# Patient Record
Sex: Male | Born: 1995 | Race: White | Hispanic: No | Marital: Single | Smoking: Never smoker
Health system: Southern US, Community
[De-identification: ages and names within clinical notes are randomized; demographics above are authoritative.]

## PROBLEM LIST (undated history)

## (undated) DIAGNOSIS — Z0110 Encounter for examination of ears and hearing without abnormal findings: Secondary | ICD-10-CM

## (undated) DIAGNOSIS — Z8781 Personal history of (healed) traumatic fracture: Secondary | ICD-10-CM

## (undated) HISTORY — DX: Encounter for examination of ears and hearing without abnormal findings: Z01.10

## (undated) HISTORY — PX: OTHER SURGICAL HISTORY: SHX169

## (undated) HISTORY — DX: Personal history of (healed) traumatic fracture: Z87.81

---
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
## 2015-04-29 ENCOUNTER — Emergency Department
Admission: EM | Admit: 2015-04-29 | Discharge: 2015-04-29 | Disposition: A | Discharge status: Home / Self Care | Payer: Managed Care, Other (non HMO) | Attending: Emergency Medicine | Admitting: Emergency Medicine

## 2015-04-29 ENCOUNTER — Encounter: Payer: Self-pay | Admitting: Emergency Medicine

## 2015-04-29 ENCOUNTER — Emergency Department: Payer: Managed Care, Other (non HMO)

## 2015-04-29 DIAGNOSIS — R1012 Left upper quadrant pain: Secondary | ICD-10-CM | POA: Diagnosis present

## 2015-04-29 DIAGNOSIS — B2790 Infectious mononucleosis, unspecified without complication: Secondary | ICD-10-CM | POA: Diagnosis not present

## 2015-04-29 NOTE — ED Provider Notes (Signed)
[facility] Emergency Department Provider Note  ____________________________________________  Time seen: 1340  I have reviewed the triage vital signs and the nursing notes.  History by:  Patient and his mother  HISTORY  Chief Complaint Abdominal Pain     HPI Bruce Howell is a 20 y.o. male who was recently diagnosed with mononucleosis. He started having weakness and other symptoms approximately 8-9 days ago. He was seen by student health in [place] where he goes to [employer]. He plays football and has been advised not to resume vigorous activity. This morning, without any event or trauma, he began to have some discomfort in his left upper abdomen. The patient and his mother are concerned about possible splenomegaly or complications of mononucleosis.   History reviewed. No pertinent past medical history.  There are no active problems to display for this patient.   History reviewed. No pertinent past surgical history.  No current outpatient prescriptions on file.  Allergies Review of patient's allergies indicates not on file.  History reviewed. No pertinent family history.  Social History Social History  Substance Use Topics  . Smoking status: Never Smoker   . Smokeless tobacco: None  . Alcohol Use: None    Review of Systems  Constitutional: Currently with mononucleosis. See history of present illness ENT: Negative for congestion. Cardiovascular: Negative for chest pain. Respiratory: Negative for cough. Gastrointestinal: Abdominal pain, left upper quadrant - see history of present illness Genitourinary: Negative for dysuria. Musculoskeletal: No back pain. Skin: Negative for rash. Neurological: Negative for headache or focal weakness   10-point ROS otherwise negative.  ____________________________________________   PHYSICAL EXAM:  VITAL SIGNS: ED Triage Vitals  Enc Vitals Group     BP 04/29/15 1241 115/81 mmHg     Pulse Rate  04/29/15 1241 69     Resp 04/29/15 1241 16     Temp 04/29/15 1241 98.1 F (36.7 C)     Temp Source 04/29/15 1241 Oral     SpO2 04/29/15 1241 100 %     Weight 04/29/15 1241 170 lb (77.111 kg)     Height 04/29/15 1241  (1.854 m)     Head Cir --      Peak Flow --      Pain Score 04/29/15 1243 6     Pain Loc --      Pain Edu? --      Excl. in GC? --     Constitutional: Alert and oriented. Well appearing and in no distress. ENT   Head: Normocephalic and atraumatic.   Nose: No congestion/rhinnorhea.       Mouth: No erythema, no swelling   Cardiovascular: Normal rate, regular rhythm, no murmur noted Respiratory:  Normal respiratory effort, no tachypnea.    Breath sounds are clear and equal bilaterally.  Gastrointestinal: Soft, no distention. Minimal tenderness in the left upper quadrant wrapping around a little bit laterally. Back: No muscle spasm, no tenderness, no CVA tenderness. Musculoskeletal: No deformity noted. Nontender with normal range of motion in all extremities.  No noted edema. Neurologic:  Communicative. Normal appearing spontaneous movement in all 4 extremities. No gross focal neurologic deficits are appreciated.  Skin:  Skin is warm, dry. No rash noted. Psychiatric: Mood and affect are normal. Speech and behavior are normal.  ____________________________________________    LABS (pertinent positives/negatives)    ____________________________________________   EKG    ____________________________________________    RADIOLOGY  Ultrasound, left upper quadrant IMPRESSION: Increased splenic size when compared to the 2011 CT  Abdomen and Pelvis, but splenic volume remains within the normal range. No discrete splenic lesion or perisplenic fluid.  ____________________________________________   PROCEDURES  ____________________________________________   INITIAL IMPRESSION / ASSESSMENT AND PLAN / ED COURSE  Pertinent labs & imaging results that  were available during my care of the patient were reviewed by me and considered in my medical decision making (see chart for details).  Overall well-appearing 19 ye40ar old male in no acute distress. He has had some discomfort in left upper quadrant. He has been recently diagnosed with mononucleosis. We will obtain an ultrasound to assess spleen size primarily for baseline measurement purposes. I have very low too little suspicion for a rupture currently.  ----------------------------------------- 3:06 PM on 04/29/2015 -----------------------------------------  Ultrasound shows an increased splenic size compared to a previous CT but the volume remains within normal range. No acute abnormality noted.  ____________________________________________   FINAL CLINICAL IMPRESSION(S) / ED DIAGNOSES  Final diagnoses:  Abdominal pain, left upper quadrant   mononucleosis    Darien Ramus, MD 04/29/15 [phone]

## 2015-04-29 NOTE — ED Notes (Signed)
Dx with mono and an earache on Friday - has left upper abd pain, and parents concerned. vss

## 2015-04-29 NOTE — Discharge Instructions (Signed)
Your ultrasound showed a spleen that was within a normal size range, though is slightly bigger then how it appeared 6 years ago on CT scan. As previously instructed, avoid trauma, collisions, including contact sports. If your pain worsens or if you have other urgent concerns, return to the emergency department.

## 2015-10-12 ENCOUNTER — Ambulatory Visit (INDEPENDENT_AMBULATORY_CARE_PROVIDER_SITE_OTHER): Payer: Managed Care, Other (non HMO) | Admitting: Family Medicine

## 2015-10-12 ENCOUNTER — Encounter: Payer: Self-pay | Admitting: Family Medicine

## 2015-10-12 VITALS — BP 124/70 | HR 58 | Temp 98.2°F | Ht 71.0 in | Wt 166.0 lb

## 2015-10-12 DIAGNOSIS — Z025 Encounter for examination for participation in sport: Secondary | ICD-10-CM

## 2015-10-12 DIAGNOSIS — Z130 Encounter for screening for diseases of the blood and blood-forming organs and certain disorders involving the immune mechanism: Secondary | ICD-10-CM | POA: Diagnosis not present

## 2015-10-12 NOTE — Patient Instructions (Signed)
Sickle cell screening test today  No restrictions for football Be careful and stay hydrated

## 2015-10-12 NOTE — Progress Notes (Signed)
Subjective:    Patient ID: Bruce Howell, male    DOB: [birth year], 20 y.o.   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 Screening for sickle-cell disease or trait 10/12/2015  . Encounter for examination for participation in sport 10/12/2015  . Well adolescent visit 11/08/2010  . DERMATITIS, SEBORRHEIC 12/31/2007  . GANGLION CYST 12/31/2007  . ALLERGIC RHINITIS, SEASONAL 09/24/2006   Past Medical History  Diagnosis Date  . Concussion 2001    no residual symptoms  . Other examination of ears and hearing     [facility]  . History of skull fracture     concussion; LOC x 3 days   Past Surgical History  Procedure Laterality Date  . Other surgical history      concussion, skull fracture--LOC x 3 days   Social History  Substance Use Topics  . Smoking status: Never Smoker   . Smokeless tobacco: None  . Alcohol Use: 0.0 oz/week    0 Standard drinks or equivalent per week     Comment: occ   No family history on file. Allergies  Allergen Reactions  . Amoxicillin     REACTION: rash   Current Outpatient Prescriptions on File Prior to Visit  Medication Sig Dispense Refill  . Multiple Vitamin (MULTIVITAMIN) tablet Take 1 tablet by  mouth daily.       No current facility-administered medications on file prior to visit.    Review of Systems Review of Systems  Constitutional: Negative for fever, appetite change, fatigue and unexpected weight change.  Eyes: Negative for pain and visual disturbance.  Respiratory: Negative for cough and shortness of breath.   Cardiovascular: Negative for cp or palpitations    Gastrointestinal: Negative for nausea, diarrhea and constipation.  Genitourinary: Negative for urgency and frequency.  Skin: Negative for pallor or rash   Neurological: Negative for weakness, light-headedness, numbness and headaches.  Hematological: Negative for adenopathy. Does not bruise/bleed easily.  Psychiatric/Behavioral: Negative for dysphoric mood. The patient is not nervous/anxious.         Objective:   Physical Exam  Constitutional: He appears well-developed and well-nourished. No distress.  Well appearing   HENT:  Head: Normocephalic and atraumatic.  Mouth/Throat: Oropharynx is clear and moist.  Eyes: Conjunctivae and EOM are normal. Pupils are equal, round, and reactive to light. No scleral icterus.  Neck: Normal range of motion. Neck supple. No thyromegaly present.  Cardiovascular: Normal rate, regular rhythm and normal heart sounds.   Pulmonary/Chest: Effort normal and breath sounds normal. He has no wheezes.  Musculoskeletal: He exhibits no edema or  tenderness.  No scoliosis   Lymphadenopathy:    He has no cervical adenopathy.  Neurological: He is alert. He has normal reflexes. No cranial nerve deficit. He exhibits normal muscle tone. Coordination normal.  Skin: Skin is warm and dry. No rash noted. No erythema.  Psychiatric: He has a normal mood and affect.          Assessment & Plan:   Problem List Items Addressed This Visit      Other   Screening for sickle-cell disease or trait - Primary    Screening test done today as required by his program for football  No family hx of  symptoms       Relevant Orders   Sickle cell screen (Completed)   Encounter for examination for participation in sport    Pt had one concussion last year - has resolved Disc prev of injuries and dehydration for sports  No restrictions for upcoming season [facility] by [facility] program

## 2015-10-12 NOTE — Progress Notes (Signed)
Pre visit review using our clinic review tool, if applicable. No additional management support is needed unless otherwise documented below in the visit note. 

## 2015-10-13 LAB — SICKLE CELL SCREEN: Sickle Cell Screen: NEGATIVE

## 2015-10-14 NOTE — Assessment & Plan Note (Signed)
Pt had one concussion last year - has resolved Disc prev of injuries and dehydration for sports  No restrictions for upcoming season [facility] by [facility] program

## 2015-10-14 NOTE — Assessment & Plan Note (Signed)
Screening test done today as required by his program for football  No family hx of symptoms

## 2015-10-24 ENCOUNTER — Encounter: Payer: Self-pay | Admitting: Family Medicine

## 2015-11-14 ENCOUNTER — Telehealth: Payer: Self-pay | Admitting: Family Medicine

## 2015-11-14 NOTE — Telephone Encounter (Signed)
Done and pt's mother notified

## 2015-11-14 NOTE — Telephone Encounter (Signed)
Pt needs immunizations faxed to [employer] fax number [phone]

## 2017-04-23 IMAGING — US US ABDOMEN LIMITED
1 series · 12 of 12 positions shown · non-contrast
Comparison: CT Abdomen and Pelvis 03/29/2010

CLINICAL DATA: 19-year-old male diagnosed with mononucleosis. Left
upper quadrant pain for 6 hours. Initial encounter.

EXAM:
LIMITED ABDOMINAL ULTRASOUND

[Series 1: us abdomen limited · 0.22mm/px · 12 of 12 slices shown]
[im 1/12]
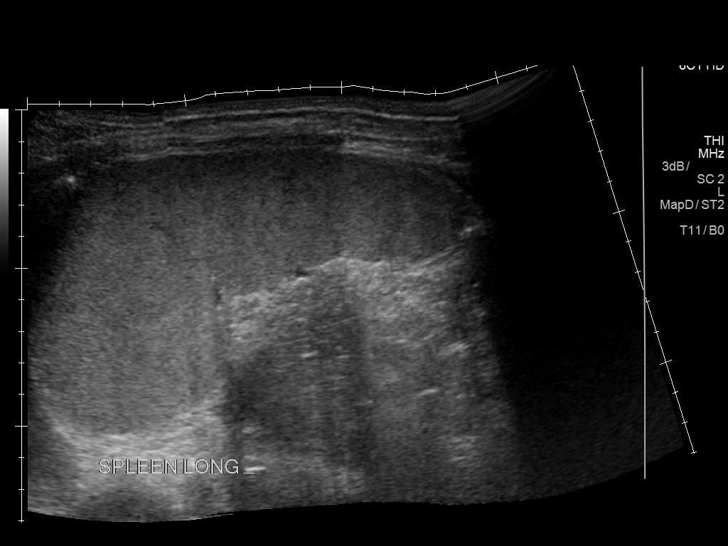
[im 2/12]
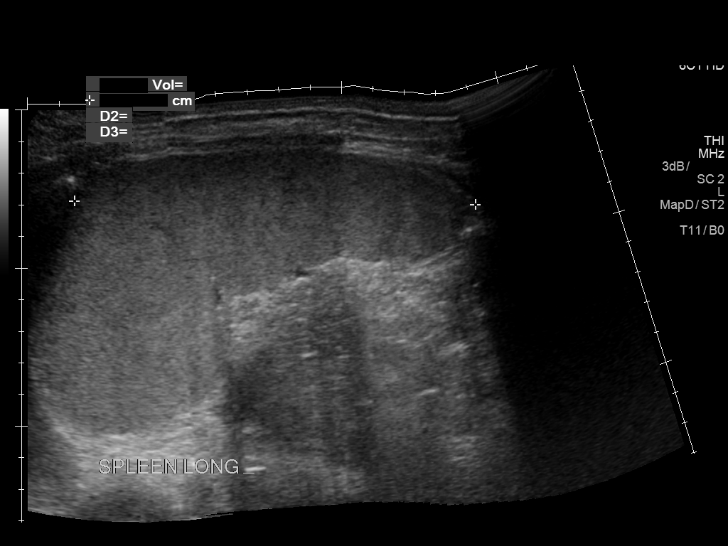
[im 3/12]
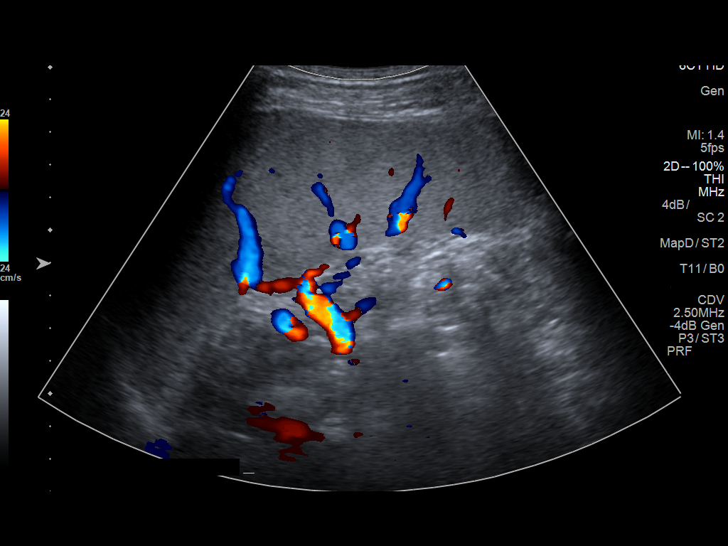
[im 4/12]
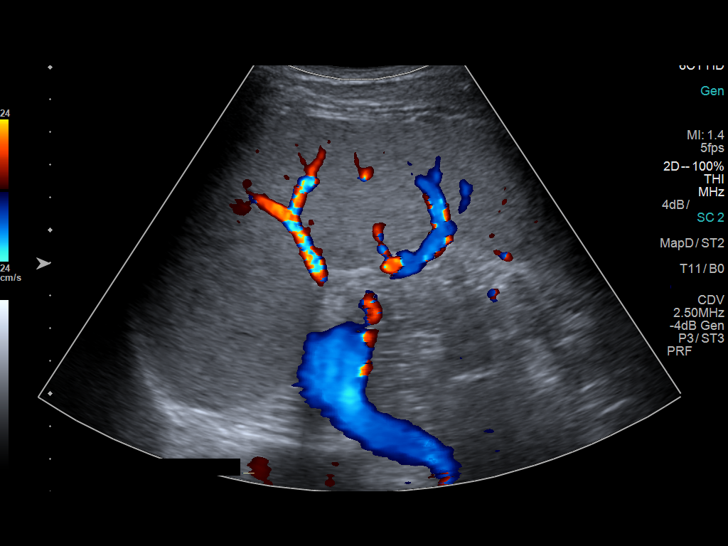
[im 5/12]
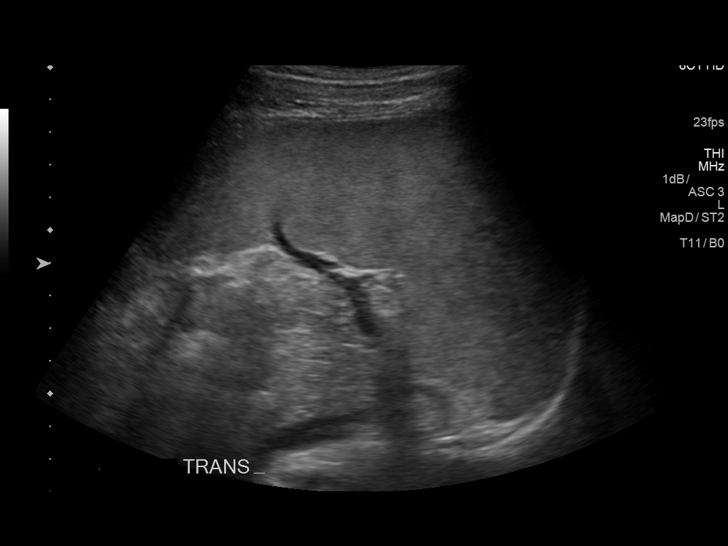
[im 6/12]
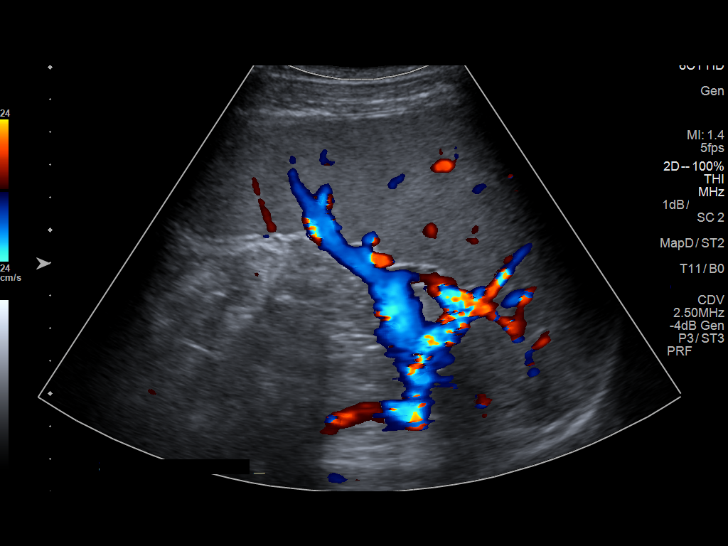
[im 7/12]
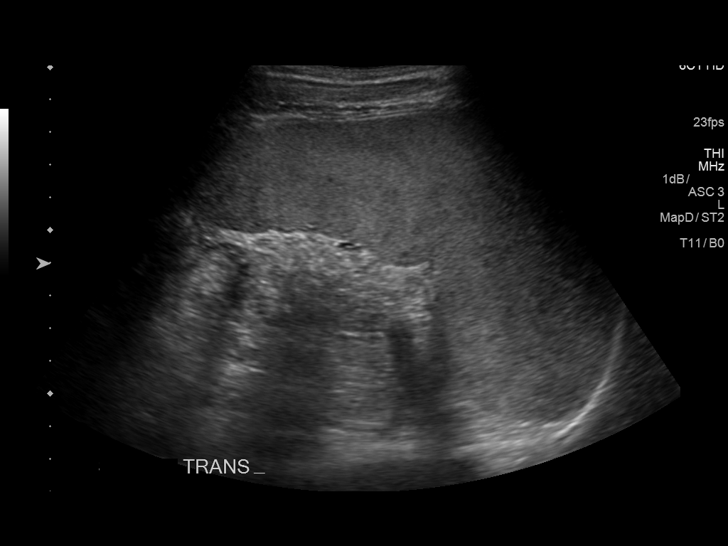
[im 8/12]
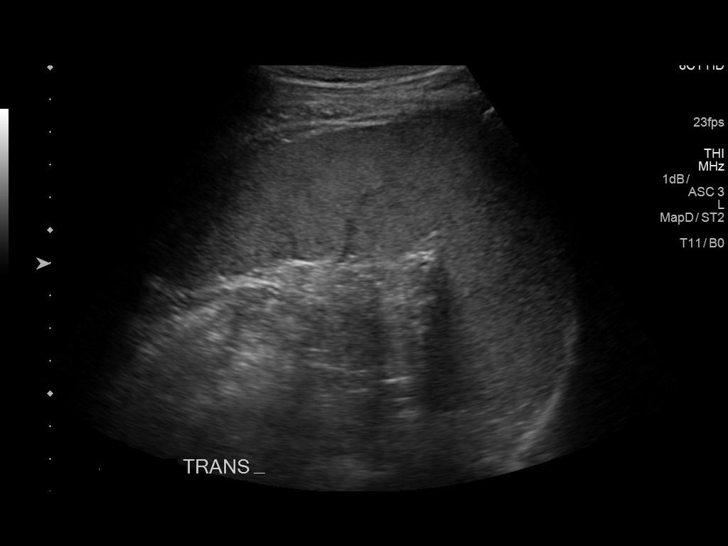
[im 9/12]
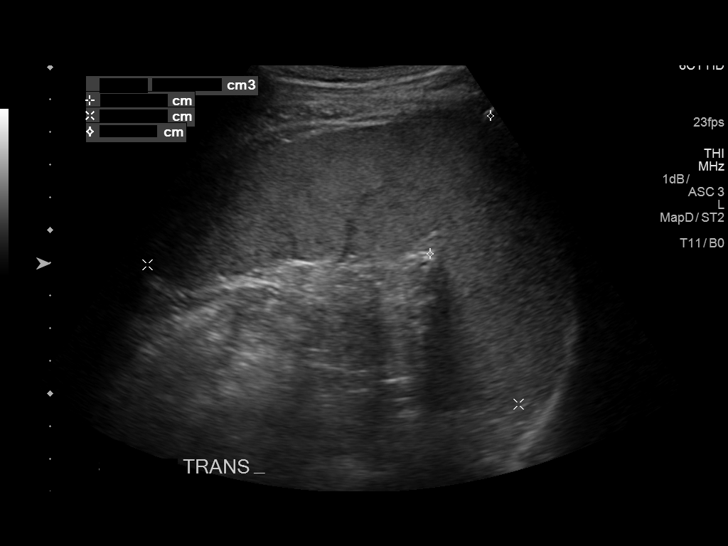
[im 10/12]
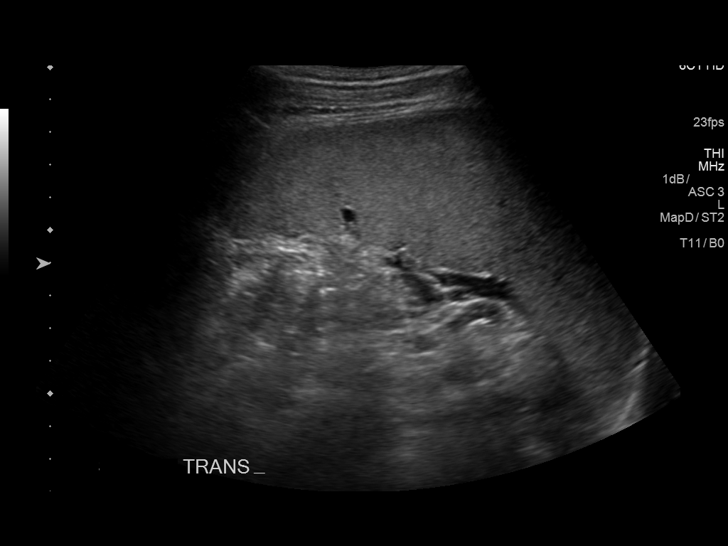
[im 11/12]
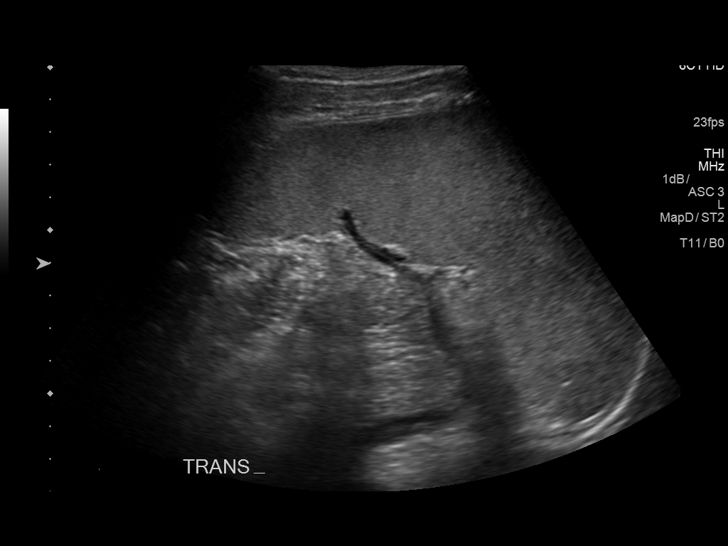
[im 12/12]
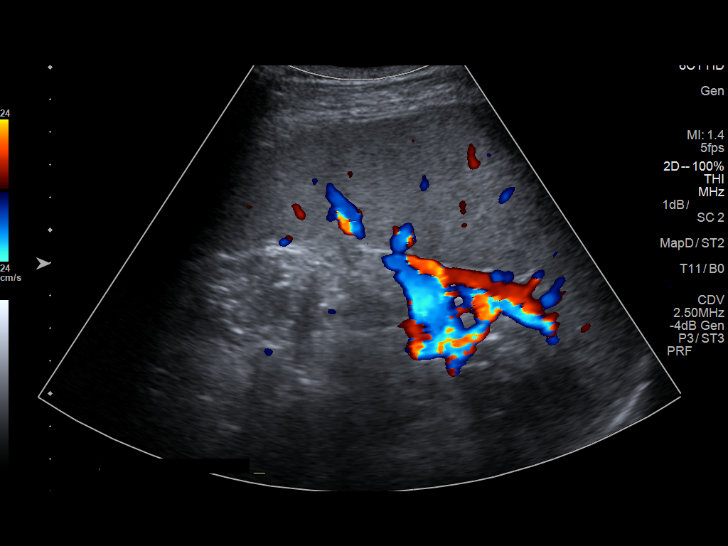

[12 of 12 positions shown; findings below may reference images not displayed]

FINDINGS: Splenic length is 12.6 cm with splenic volume today estimated at 370
mL. Normal splenic volume range is considered 83 - 412 mL. Estimated
splenic volume on the 7055 comparison of 260 mL.

Splenic echotexture appears normal. No discrete splenic lesion. No
perisplenic fluid.
IMPRESSION: Increased splenic size when compared to the 7055 CT Abdomen and
Pelvis, but splenic volume remains within the normal range. No
discrete splenic lesion or perisplenic fluid.

## 2018-09-08 ENCOUNTER — Other Ambulatory Visit: Payer: Self-pay

## 2018-09-08 ENCOUNTER — Ambulatory Visit: Payer: Managed Care, Other (non HMO) | Admitting: Family Medicine

## 2018-09-08 ENCOUNTER — Encounter: Payer: Self-pay | Admitting: Family Medicine

## 2018-09-08 VITALS — BP 122/84 | HR 96 | Temp 98.2°F | Ht 71.0 in | Wt 168.0 lb

## 2018-09-08 DIAGNOSIS — Z7689 Persons encountering health services in other specified circumstances: Secondary | ICD-10-CM

## 2018-09-08 DIAGNOSIS — T753XXA Motion sickness, initial encounter: Secondary | ICD-10-CM | POA: Diagnosis not present

## 2018-09-08 NOTE — Progress Notes (Signed)
Subjective:    Patient ID: Bruce Howell, male    DOB: [birth year], 23 y.o.   MRN: [id number]  HPI This is a 23 yo male who presents today as a new patient with new complaint.Getting ready to graduate from [employer]. 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 He has one class remaining. Enjoys surfing, fishing.  Has been getting care at [facility], last CPE 11/2017.   He reports recent sensation of feeling like he is on a boat which followed a deep sea fishing trip from which he returned 3 days ago. He has persistent sensation of still being on the boat. No sensation of spinning, feels like he has had too many alcoholic beverages. Has tried dramamine non drowsy formula (Meclizine 25 mg x 2) without relief. Did ok while out deep sea fishing for about 7 hours. Felt ok on the boat. No sore throat, no nasal congestion, no fever, no ear pain or pressure. No visual changes, no unusual headache. Doesn't drink much liquid- 1 bottle of water and some Gatorade daily. Exercises regularly and has been exercising this week, not as hard as usual, feels a little fatigued. Has been feeling a little more anxious with this persistent feeling, was doing fine prior.   Past Medical History:  Diagnosis Date  . Concussion 2001   no residual symptoms  . History of skull fracture    concussion; LOC x 3 days  . Other examination of ears and hearing    borderline [facility]   Past Surgical History:  Procedure Laterality Date  . OTHER SURGICAL HISTORY     concussion, skull fracture--LOC x 3 days   No family history on file. Social History   Tobacco Use  . Smoking status: Never Smoker  . Smokeless tobacco: Current User    Types: Chew  Substance Use Topics  . Alcohol use: Yes    Alcohol/week: 0.0 standard drinks    Comment: occ  . Drug use: No      Review of Systems Per HPI    Objective:   Physical Exam Vitals signs reviewed.  Constitutional:    General: He is not in acute distress.    Appearance: Normal appearance. He is normal weight. He is not ill-appearing, toxic-appearing or diaphoretic.  HENT:     Head: Normocephalic and atraumatic.     Right Ear: Tympanic membrane, ear canal and external ear normal.     Left Ear: Tympanic membrane, ear canal and external ear normal.     Nose: Septal deviation present. No congestion or rhinorrhea.     Mouth/Throat:     Mouth: Mucous membranes are moist.     Pharynx: Oropharynx is clear.  Eyes:     Conjunctiva/sclera: Conjunctivae normal.  Neck:     Musculoskeletal: Normal range of motion and neck supple. No neck rigidity.  Cardiovascular:     Rate and Rhythm: Normal rate and regular rhythm.     Heart sounds: Normal heart sounds.  Pulmonary:     Effort: Pulmonary effort is normal.     Breath sounds: Normal breath sounds.  Lymphadenopathy:     Cervical: No cervical adenopathy.  Skin:    General: Skin is warm and dry.  Neurological:     Mental Status: He is alert and oriented to person, place, and time.     Gait: Gait is intact.  Psychiatric:        Mood and Affect: Mood normal.  Behavior: Behavior normal.        Thought Content: Thought content normal.        Judgment: Judgment normal.       BP 122/84   Pulse 96   Temp 98.2 F (36.8 C) (Oral)   Ht 5\' 11"  (1.803 m)   Wt 168 lb (76.2 kg)   SpO2 98%   BMI 23.43 kg/m   Assessment & Plan:  1. Encounter to establish care - Discussed and encouraged healthy lifestyle choices- adequate sleep, regular exercise, stress management and healthy food choices.    2. Motion sickness, initial encounter - Advised him to increase fluids, try original Dramamine (dimenhydranate) - Follow up if no improvement in 2 days, sooner if any worsening symptoms - if persistent, consider vestibular rehab   Olean Ree, FNP-BC  [facility] at [facility], [facility]  09/08/2018 4:48 PM

## 2018-09-08 NOTE — Patient Instructions (Signed)
Increase hydration to 8-9 glasses of liquid a day  Try the over the counter regular dramamine (not N)- take one hour before bedtime  If not better in 48 hours please send me a mychart message and include any additional symptoms.

## 2018-09-10 ENCOUNTER — Telehealth: Payer: Managed Care, Other (non HMO) | Admitting: Family

## 2018-09-10 DIAGNOSIS — W57XXXA Bitten or stung by nonvenomous insect and other nonvenomous arthropods, initial encounter: Secondary | ICD-10-CM

## 2018-09-10 DIAGNOSIS — S70262A Insect bite (nonvenomous), left hip, initial encounter: Secondary | ICD-10-CM | POA: Diagnosis not present

## 2018-09-10 MED ORDER — DOXYCYCLINE HYCLATE 100 MG PO TABS: 200.0000 mg | ORAL_TABLET | Freq: Once | ORAL | 0 refills | Status: AC

## 2018-09-10 NOTE — Progress Notes (Signed)
Thank you for describing your tick bite, Here is how we plan to help! Based on the information that you shared with me it looks like you have A tick that bite that we will treat with a short course of doxycycline.  In most cases a tick bite is painless and does not itch.  Most tick bites in which the tick is quickly removed do not require prescriptions. Ticks can transmit several diseases if they are infected and remain attacked to your skin. Therefore the length that the tick was attached and any symptoms you have experienced after the bite are import to accurately develop your custom treatment plan. In most cases a single dose of doxycycline may prevent the development of a more serious condition.  Based on your information I have Provided a home care guide for tick bites  and  instructions on when to call for help. and I have sent a single dose of doxycycline to the pharmacy you selected. Please make sure that you selected a pharmacy that is open now.   Approximately 5 minutes was spent documenting and reviewing patient's chart.    Which ticks  are associated with illness?  The Wood Tick (dog tick) is the size of a watermelon seed and can sometimes transmit Rocky Mountain spotted fever and [place] tick fever.   The Deer Tick (black-legged tick) is between the size of a poppy seed (pin head) and an apple seed, and can sometimes transmit Lyme disease.  A brown to black tick with a white splotch on its back is likely a male Amblyomma americanum (Lone Star tick). This tick has been associated with Southern Tick Associated illness ( STARI)  Lyme disease has become the most common tick-borne illness in the [place]. The risk of Lyme disease following a recognized deer tick bite is estimated to be 1%.  The majority of cases of Lyme disease start with a bull's eye rash at the site of the tick bite. The rash can occur days to weeks (typically 7-10 days) after a tick bite. Treatment with  antibiotics is indicated if this rash appears. Flu-like symptoms may accompany the rash, including: fever, chills, headaches, muscle aches, and fatigue. Removing ticks promptly may prevent tick borne disease.  What can be used to prevent Tick Bites?   Insect repellant with at leas 20% DEET.  Wearing long pants with sock and shoes.  Avoiding tall grass and heavily wooded areas.  Checking your skin after being outdoors.  Shower with a washcloth after outdoor exposures.  HOME CARE ADVICE FOR TICK BITE  1. Wood Tick Removal:  o Use a pair of tweezers and grasp the wood tick close to the skin (on its head). Pull the wood tick straight upward without twisting or crushing it. Maintain a steady pressure until it releases its grip.   o If tweezers aren't available, use fingers, a loop of thread around the jaws, or a needle between the jaws for traction.  o Note: covering the tick with petroleum jelly, nail polish or rubbing alcohol doesn't work. Neither does touching the tick with a hot or cold object. 2. Tiny Deer Tick Removal:   o Needs to be scraped off with a knife blade or credit card edge. o Place tick in a sealed container (e.g. glass jar, zip lock plastic bag), in case your doctor wants to see it. 3. Tick's Head Removal:  o If the wood tick's head breaks off in the skin, it must be removed. Clean the   skin. Then use a sterile needle to uncover the head and lift it out or scrape it off.  o If a very small piece of the head remains, the skin will eventually slough it off. 4. Antibiotic Ointment:  o Wash the wound and your hands with soap and water after removal to prevent catching any tick disease.  Apply an over the counter antibiotic ointment (e.g. bacitracin) to the bite once. 5. Expected Course: Tick bites normally don't itch or hurt. That's why they often go unnoticed. 6. Call Your Doctor If:  o You can't remove the tick or the tick's head o Fever, a severe head ache, or rash occur  in the next 2 weeks o Bite begins to look infected o Lyme's disease is common in your area o You have not had a tetanus in the last 10 years o Your current symptoms become worse    MAKE SURE YOU   Understand these instructions.  Will watch your condition.  Will get help right away if you are not doing well or get worse.   Thank you for choosing an e-visit.  Your e-visit answers were reviewed by a board certified advanced clinical practitioner to complete your personal care plan. Depending upon the condition, your plan could have included both over the counter or prescription medications. Please review your pharmacy choice. If there is a problem you may use MyChart messaging to have the prescription routed to another pharmacy. Your safety is important to us. If you have drug allergies check your prescription carefully.   You can use MyChart to ask questions about today's visit, request a non-urgent call back, or ask for a work or school excuse for 24 hours related to this e-Visit. If it has been greater than 24 hours you will need to follow up with your provider, or enter a new e-Visit to address those concerns.  You will get an email in the next two days asking about your experience. I hope  that your e-visit has been valuable and will speed your recovery  

## 2019-05-11 ENCOUNTER — Encounter: Payer: Self-pay | Admitting: Emergency Medicine

## 2019-05-11 ENCOUNTER — Emergency Department
Admission: EM | Admit: 2019-05-11 | Discharge: 2019-05-11 | Disposition: A | Discharge status: Home / Self Care | Payer: Managed Care, Other (non HMO) | Attending: Emergency Medicine | Admitting: Emergency Medicine

## 2019-05-11 ENCOUNTER — Emergency Department
Admission: EM | Admit: 2019-05-11 | Discharge: 2019-05-11 | Discharge status: Against Medical Advice | Payer: Managed Care, Other (non HMO) | Source: Home / Self Care

## 2019-05-11 DIAGNOSIS — Z0130 Encounter for examination of blood pressure without abnormal findings: Secondary | ICD-10-CM | POA: Insufficient documentation

## 2019-05-11 DIAGNOSIS — Z5321 Procedure and treatment not carried out due to patient leaving prior to being seen by health care provider: Secondary | ICD-10-CM | POA: Diagnosis not present

## 2019-05-11 NOTE — ED Triage Notes (Signed)
Patient ambulatory to triage with steady gait, without difficulty or distress noted, mask in place; pt reports "I just want to get my blood pressure checked"; pt denies desire to see provider and denies c/o & declines further triage

## 2019-05-11 NOTE — ED Triage Notes (Signed)
Pt arrived to triage with pupils dilated and hoodie on sweatshirt over head. Pt reports his parents made him come to the ED due to their concern for pt being on drugs. Pt denies drugs and denies SI and HI. Pt has hx/o PTSD and takes nightly medication, unknown by pt.

## 2019-05-11 NOTE — ED Notes (Signed)
Pt refusing triage blood and urine sample.
